# Patient Record
Sex: Female | Born: 1990 | Hispanic: Yes | Marital: Single | Smoking: Never smoker
Health system: Southern US, Community
[De-identification: ages and names within clinical notes are randomized; demographics above are authoritative.]

---

## 2019-04-15 ENCOUNTER — Encounter (HOSPITAL_COMMUNITY): Payer: Self-pay

## 2019-04-15 ENCOUNTER — Ambulatory Visit (HOSPITAL_COMMUNITY)
Admission: EM | Admit: 2019-04-15 | Discharge: 2019-04-15 | Disposition: A | Discharge status: Home / Self Care | Payer: Self-pay | Attending: Emergency Medicine | Admitting: Emergency Medicine

## 2019-04-15 ENCOUNTER — Other Ambulatory Visit: Payer: Self-pay

## 2019-04-15 DIAGNOSIS — Z202 Contact with and (suspected) exposure to infections with a predominantly sexual mode of transmission: Secondary | ICD-10-CM | POA: Insufficient documentation

## 2019-04-15 DIAGNOSIS — Z3202 Encounter for pregnancy test, result negative: Secondary | ICD-10-CM

## 2019-04-15 DIAGNOSIS — Z7251 High risk heterosexual behavior: Secondary | ICD-10-CM | POA: Insufficient documentation

## 2019-04-15 LAB — POC URINE PREG, ED: Preg Test, Ur: NEGATIVE

## 2019-04-15 LAB — HIV ANTIBODY (ROUTINE TESTING W REFLEX): HIV Screen 4th Generation wRfx: NONREACTIVE

## 2019-04-15 LAB — POCT PREGNANCY, URINE: Preg Test, Ur: NEGATIVE

## 2019-04-15 MED ORDER — AZITHROMYCIN 250 MG PO TABS
1000.0000 mg | ORAL_TABLET | Freq: Once | ORAL | Status: AC
  Administered 2019-04-15: 1000 mg via ORAL

## 2019-04-15 MED ORDER — AZITHROMYCIN 250 MG PO TABS
ORAL_TABLET | ORAL | Status: AC
  Filled 2019-04-15: qty 4

## 2019-04-15 MED ORDER — CEFTRIAXONE SODIUM 250 MG IJ SOLR
250.0000 mg | Freq: Once | INTRAMUSCULAR | Status: AC
  Administered 2019-04-15: 250 mg via INTRAMUSCULAR

## 2019-04-15 MED ORDER — CEFTRIAXONE SODIUM 250 MG IJ SOLR
INTRAMUSCULAR | Status: AC
  Filled 2019-04-15: qty 250

## 2019-04-15 NOTE — Discharge Instructions (Addendum)
Will notify you of any positive findings and if any changes to treatment are needed.   You may monitor your results on your MyChart online as well.   Please withhold from intercourse for the next week. Please use condoms to prevent STD's.

## 2019-04-15 NOTE — ED Triage Notes (Signed)
Pt. States she had a 4 some & 2 of the 4 people had tested POSITIVE for Gonorrhea & just finished their treatment. She states that she wants STD/BV/HIV/AIDS testing.

## 2019-04-15 NOTE — ED Provider Notes (Signed)
[facility]    CSN: [id number] Arrival date & time: 04/15/19  1647      History   Chief Complaint Chief Complaint  Patient presents with  . S.74    HPI Donna Avila is a 28 y.o. female.   Donna Avila presents with concerns about std exposure. She is visiting from [place], and on 11/27 and last night had sexual relations with 4 men and her friend/cousin. States two of involved parties just completed treatment for gonorrhea the night prior to the activity. She denies any vaginal symptoms. Has had std's in the past. Doesn't use condoms regularly. No pelvic pain. No urinary symptoms. LMp was 6 mo ago, she is on depo.    ROS per HPI, negative if not otherwise mentioned.      History reviewed. No pertinent past medical history.  There are no active problems to display for this patient.   History reviewed. No pertinent surgical history.  OB History   No obstetric history on file.      Home Medications    Prior to Admission medications   Not on File    Family History Family History  Problem Relation Age of Onset  . Diabetes Mother   . Hypertension Mother   . Arthritis Mother   . Healthy Father     Social History Social History   Tobacco Use  . Smoking status: Never Smoker  . Smokeless tobacco: Never Used  Substance Use Topics  . Alcohol use: Yes  . Drug use: Never     Allergies   Patient has no known allergies.   Review of Systems Review of Systems   Physical Exam Triage Vital Signs ED Triage Vitals  Enc Vitals Group     BP 04/15/19 1800 (!) 114/51     Pulse Rate 04/15/19 1800 61     Resp 04/15/19 1800 19     Temp 04/15/19 1800 97.8 F (36.6 C)     Temp Source 04/15/19 1800 Oral     SpO2 04/15/19 1800 98 %     Weight 04/15/19 1751 215 lb (97.5 kg)     Height --      Head Circumference --      Peak Flow --      Pain Score 04/15/19 1751 0     Pain Loc --      Pain Edu? --      Excl. in [place]? --    No data found.  Updated  Vital Signs BP (!) 114/51 (BP Location: Left Arm)   Pulse 61   Temp 97.8 F (36.6 C) (Oral)   Resp 19   Wt 215 lb (97.5 kg)   SpO2 98%    Physical Exam Constitutional:      General: She is not in acute distress.    Appearance: She is well-developed.  Cardiovascular:     Rate and Rhythm: Normal rate.  Pulmonary:     Effort: Pulmonary effort is normal.  Abdominal:     Palpations: Abdomen is not rigid.     Tenderness: There is no abdominal tenderness. There is no guarding or rebound.  Genitourinary:    Comments: Denies sores, lesions, vaginal bleeding; no pelvic pain; gu exam deferred at this time, vaginal self swab collected.   Skin:    General: Skin is warm and dry.  Neurological:     Mental Status: She is alert and oriented to person, place, and time.      UC Treatments / Results  Labs (all labs ordered are listed, but only abnormal results are displayed) Labs Reviewed  RPR  HIV ANTIBODY (ROUTINE TESTING W REFLEX)  POC URINE PREG, ED  POCT PREGNANCY, URINE  CERVICOVAGINAL ANCILLARY ONLY    EKG   Radiology No results found.  Procedures Procedures (including critical care time)  Medications Ordered in UC Medications  azithromycin (ZITHROMAX) tablet 1,000 mg (1,000 mg Oral Given 04/15/19 1827)  cefTRIAXone (ROCEPHIN) injection 250 mg (250 mg Intramuscular Given 04/15/19 1827)  azithromycin (ZITHROMAX) 250 MG tablet (has no administration in time range)  cefTRIAXone (ROCEPHIN) 250 MG injection (has no administration in time range)    Initial Impression / Assessment and Plan / UC Course  I have reviewed the triage vital signs and the nursing notes.  Pertinent labs & imaging results that were available during my care of the patient were reviewed by me and considered in my medical decision making (see chart for details).     Empiric gonorrhea treatment provided tonight pending vaginal cytology. RPR and HIV testing also completed. Safe sex encouraged. Patient  verbalized understanding and agreeable to plan.   Final Clinical Impressions(s) / UC Diagnoses   Final diagnoses:  High risk sexual behavior, unspecified type  Possible exposure to STD     Discharge Instructions     Will notify you of any positive findings and if any changes to treatment are needed.   You may monitor your results on your MyChart online as well.   Please withhold from intercourse for the next week. Please use condoms to prevent STD's.     ED Prescriptions    None     PDMP not reviewed this encounter.   Georgetta Haber, NP 04/15/19 [phone]

## 2019-04-16 LAB — RPR: RPR Ser Ql: NONREACTIVE

## 2019-04-18 LAB — CERVICOVAGINAL ANCILLARY ONLY
Bacterial vaginitis: POSITIVE — AB
Candida vaginitis: NEGATIVE
Chlamydia: NEGATIVE
Neisseria Gonorrhea: NEGATIVE
Trichomonas: NEGATIVE

## 2019-04-19 ENCOUNTER — Telehealth (HOSPITAL_COMMUNITY): Payer: Self-pay | Admitting: Emergency Medicine

## 2019-04-19 MED ORDER — METRONIDAZOLE 500 MG PO TABS: 500.0000 mg | ORAL_TABLET | Freq: Two times a day (BID) | ORAL | 0 refills | Status: AC

## 2019-04-19 NOTE — Telephone Encounter (Signed)
Bacterial vaginosis is positive. This was not treated at the urgent care visit.  Flagyl 500 mg BID x 7 days #14 no refills sent to patients pharmacy of choice.    Patient contacted by phone and made aware of    results. Pt verbalized understanding and had all questions answered.    

## 2019-04-19 NOTE — Telephone Encounter (Signed)
Pharmacy change

## 2020-05-10 ENCOUNTER — Encounter (HOSPITAL_COMMUNITY): Payer: Self-pay

## 2020-05-10 ENCOUNTER — Other Ambulatory Visit: Payer: Self-pay

## 2020-05-10 ENCOUNTER — Emergency Department (HOSPITAL_COMMUNITY)
Admission: EM | Admit: 2020-05-10 | Discharge: 2020-05-10 | Disposition: A | Discharge status: Home / Self Care | Payer: HRSA Program | Attending: Emergency Medicine | Admitting: Emergency Medicine

## 2020-05-10 ENCOUNTER — Emergency Department (HOSPITAL_COMMUNITY): Payer: HRSA Program

## 2020-05-10 DIAGNOSIS — U071 COVID-19: Secondary | ICD-10-CM | POA: Diagnosis not present

## 2020-05-10 DIAGNOSIS — R059 Cough, unspecified: Secondary | ICD-10-CM | POA: Diagnosis present

## 2020-05-10 LAB — RESP PANEL BY RT-PCR (FLU A&B, COVID) ARPGX2
Influenza A by PCR: NEGATIVE
Influenza B by PCR: NEGATIVE
SARS Coronavirus 2 by RT PCR: POSITIVE — AB

## 2020-05-10 MED ORDER — ONDANSETRON 4 MG PO TBDP: 4.0000 mg | ORAL_TABLET | Freq: Three times a day (TID) | ORAL | 0 refills | Status: AC | PRN

## 2020-05-10 MED ORDER — ACETAMINOPHEN 325 MG PO TABS
650.0000 mg | ORAL_TABLET | Freq: Once | ORAL | Status: AC | PRN
  Administered 2020-05-10: 650 mg via ORAL
  Filled 2020-05-10: qty 2

## 2020-05-10 NOTE — Discharge Instructions (Addendum)
Like we discussed, I am prescribing a medication called Zofran. This will help with your nausea. Only take it if you are experiencing nausea and vomiting that you cannot control and is causing you to not get adequate hydration.  You need to make sure that you are quarantining for 10 to 14 days from onset of your symptoms.  You can continue to take Tylenol and Motrin for management of your fevers and body aches. Follow the instructions on the bottle.  Also consider purchasing a pulse oximeter. You can find these online for $10-$15. If your saturations drop below 90% and stay there for 3 to 5 minutes, it is reasonable to come back to the emergency department and be reevaluated.  It was a pleasure to meet you.

## 2020-05-10 NOTE — ED Provider Notes (Signed)
[facility] EMERGENCY DEPARTMENT Provider Note   CSN: [id number] Arrival date & time: 05/10/20  1025     History Chief Complaint  Patient presents with  . Generalized Body Aches    Donna Avila is a 30 y.o. female.  HPI Patient is a 30 year old female who presents the emergency department with cough. She states it started yesterday. She woke up this morning with fever as well as body aches. She states she felt mildly short of breath this morning as well but this is since resolved. Denies any chest pain. She has had 1 dose of the Pfizer vaccine about 3 months ago but did not receive a second dose. Reports associated nausea without vomiting. No abdominal pain, leg swelling, or calf pain.    History reviewed. No pertinent past medical history.  There are no problems to display for this patient.   History reviewed. No pertinent surgical history.   OB History   No obstetric history on file.     Family History  Problem Relation Age of Onset  . Diabetes Mother   . Hypertension Mother   . Arthritis Mother   . Healthy Father     Social History   Tobacco Use  . Smoking status: Never Smoker  . Smokeless tobacco: Never Used  Substance Use Topics  . Alcohol use: Yes  . Drug use: Never    Home Medications Prior to Admission medications   Not on File    Allergies    Patient has no known allergies.  Review of Systems   Review of Systems  Constitutional: Positive for activity change, appetite change, chills, fatigue and fever.  HENT: Positive for congestion, postnasal drip, rhinorrhea and sneezing.   Respiratory: Positive for cough. Negative for shortness of breath.   Cardiovascular: Negative for chest pain.  Gastrointestinal: Positive for nausea. Negative for abdominal pain and vomiting.    Physical Exam Updated Vital Signs BP 100/90 (BP Location: Right Arm)   Pulse 91   Temp 99.6 F (37.6 C) (Oral)   Resp 20   SpO2 100%   Physical  Exam Vitals and nursing note reviewed.  Constitutional:      General: She is not in acute distress.    Appearance: Normal appearance. She is not ill-appearing, toxic-appearing or diaphoretic.  HENT:     Head: Normocephalic and atraumatic.     Right Ear: External ear normal.     Left Ear: External ear normal.     Nose: Nose normal.     Mouth/Throat:     Pharynx: Oropharynx is clear.  Eyes:     Extraocular Movements: Extraocular movements intact.  Cardiovascular:     Rate and Rhythm: Normal rate and regular rhythm.     Pulses: Normal pulses.     Heart sounds: Normal heart sounds. No murmur heard. No friction rub. No gallop.   Pulmonary:     Effort: Pulmonary effort is normal. No respiratory distress.     Breath sounds: Normal breath sounds. No stridor. No wheezing, rhonchi or rales.     Comments: Lungs are clear to auscultation bilaterally. Symmetrical rise and fall the chest with inspiration and expiration. Oxygen saturations around 100% on room air. Speaking in clear and complete sentences. No respiratory distress noted. Abdominal:     General: Abdomen is flat.     Palpations: Abdomen is soft.     Tenderness: There is no abdominal tenderness.  Musculoskeletal:        General: No tenderness. Normal range  of motion.     Cervical back: Normal range of motion and neck supple. No tenderness.     Right lower leg: No edema.     Left lower leg: No edema.     Comments: No leg swelling. No calf pain.  Skin:    General: Skin is warm and dry.  Neurological:     General: No focal deficit present.     Mental Status: She is alert and oriented to person, place, and time.  Psychiatric:        Mood and Affect: Mood normal.        Behavior: Behavior normal.     ED Results / Procedures / Treatments   Labs (all labs ordered are listed, but only abnormal results are displayed) Labs Reviewed  RESP PANEL BY RT-PCR (FLU A&B, COVID) ARPGX2 - Abnormal; Notable for the following components:       Result Value   SARS Coronavirus 2 by RT PCR POSITIVE (*)    All other components within normal limits    EKG None  Radiology DG Chest Portable 1 View  Result Date: 05/10/2020 CLINICAL DATA:  Cough EXAM: PORTABLE CHEST 1 VIEW COMPARISON:  None. FINDINGS: The heart size and mediastinal contours are within normal limits. Minimal opacity of medial right lung base, developing pneumonia is not excluded. There is no pulmonary edema or pleural effusion. The visualized skeletal structures are unremarkable. IMPRESSION: Minimal opacity of medial right lung base, developing pneumonia is not excluded. Electronically Signed   By: Sherian Rein M.D.   On: 05/10/2020 13:23   Procedures Procedures (including critical care time)  Medications Ordered in ED Medications  acetaminophen (TYLENOL) tablet 650 mg (650 mg Oral Given 05/10/20 1105)   ED Course  I have reviewed the triage vital signs and the nursing notes.  Pertinent labs & imaging results that were available during my care of the patient were reviewed by me and considered in my medical decision making (see chart for details).  Clinical Course as of 05/10/20 1706  Sun May 10, 2020  1623 DG Chest Portable 1 View IMPRESSION: Minimal opacity of medial right lung base, developing pneumonia is not excluded. [LJ]    Clinical Course User Index [LJ] Placido Sou, PA-C   MDM Rules/Calculators/A&P                          Patient is a 30 year old female who presents the emergency department with symptoms related to COVID-19. She is on day 2 of symptoms. She has had 1 dose of the Pfizer vaccine.  No current chest pain or shortness of breath. No hypoxia on my exam. No respiratory distress noted. She initially presented with a fever of 101.6 F. She was given Tylenol in triage and this is resolved. She states that she is actually feeling much better since arrival. Chest x-ray shows a minimal opacity of the medial right lung base. Possible developing  pneumonia.  Discussed OTC meds for symptoms. Tylenol and Motrin for fevers and body aches. Rest and hydration. Quarantine for 10 to 14 days from symptom onset. We discussed return precautions at length. Her questions were answered and she was amicable at the time of discharge. Her vital signs are stable.  Final Clinical Impression(s) / ED Diagnoses Final diagnoses:  COVID-19   Rx / DC Orders ED Discharge Orders    None       Placido Sou, PA-C 05/10/20 1706    Milagros Loll,  MD 05/11/20 2336

## 2020-05-10 NOTE — ED Triage Notes (Signed)
Patient reports that she awoke with cough, fever and body aches since am. Patient speaking complete sentences, Nad

## 2020-05-15 ENCOUNTER — Emergency Department (HOSPITAL_COMMUNITY)
Admission: EM | Admit: 2020-05-15 | Discharge: 2020-05-15 | Disposition: A | Discharge status: Home / Self Care | Payer: HRSA Program | Attending: Emergency Medicine | Admitting: Emergency Medicine

## 2020-05-15 ENCOUNTER — Ambulatory Visit: Payer: Self-pay

## 2020-05-15 ENCOUNTER — Other Ambulatory Visit: Payer: Self-pay

## 2020-05-15 ENCOUNTER — Emergency Department (HOSPITAL_COMMUNITY): Payer: HRSA Program

## 2020-05-15 DIAGNOSIS — U071 COVID-19: Secondary | ICD-10-CM | POA: Diagnosis not present

## 2020-05-15 DIAGNOSIS — R059 Cough, unspecified: Secondary | ICD-10-CM | POA: Diagnosis present

## 2020-05-15 LAB — CBC
HCT: 37.7 % (ref 36.0–46.0)
Hemoglobin: 12.4 g/dL (ref 12.0–15.0)
MCH: 30.4 pg (ref 26.0–34.0)
MCHC: 32.9 g/dL (ref 30.0–36.0)
MCV: 92.4 fL (ref 80.0–100.0)
Platelets: 262 10*3/uL (ref 150–400)
RBC: 4.08 MIL/uL (ref 3.87–5.11)
RDW: 14.2 % (ref 11.5–15.5)
WBC: 9.4 10*3/uL (ref 4.0–10.5)
nRBC: 0 % (ref 0.0–0.2)

## 2020-05-15 LAB — BASIC METABOLIC PANEL
Anion gap: 11 (ref 5–15)
BUN: 5 mg/dL — ABNORMAL LOW (ref 6–20)
CO2: 26 mmol/L (ref 22–32)
Calcium: 8 mg/dL — ABNORMAL LOW (ref 8.9–10.3)
Chloride: 100 mmol/L (ref 98–111)
Creatinine, Ser: 0.84 mg/dL (ref 0.44–1.00)
GFR, Estimated: >60 mL/min (ref >60)
Glucose, Bld: 113 mg/dL — ABNORMAL HIGH (ref 70–99)
Potassium: 4.1 mmol/L (ref 3.5–5.1)
Sodium: 137 mmol/L (ref 135–145)

## 2020-05-15 MED ORDER — ALBUTEROL SULFATE HFA 108 (90 BASE) MCG/ACT IN AERS: 2.0000 | INHALATION_SPRAY | RESPIRATORY_TRACT | Status: DC | PRN

## 2020-05-15 MED ORDER — ACETAMINOPHEN 325 MG PO TABS
650.0000 mg | ORAL_TABLET | Freq: Once | ORAL | Status: AC | PRN
  Administered 2020-05-15: 650 mg via ORAL

## 2020-05-15 NOTE — Discharge Instructions (Addendum)
Call your primary care doctor in the next 1-2 days to arrange video follow-up.  Use a finger pulse oximeter at home.  You may purchase one at [facility] or [facility] or online.  If the numbers drops and stays below 90%, return immediately back to the ER.  Otherwise increase your fluid intake, isolate at home for 5 days after symptoms resolve, and inform recent close contacts of the need to test for Covid.  

## 2020-05-15 NOTE — ED Provider Notes (Signed)
[facility] EMERGENCY DEPARTMENT Provider Note   CSN: [id number] Arrival date & time: 05/15/20  1132     History Chief Complaint  Patient presents with  . Covid Positive  . Shortness of Breath    Donna Avila is a 30 y.o. female.  Patient presents to ER chief complaint of cough shortness of breath and fevers.  Has been ongoing for 7 days now.  She has a positive for Covid about 6 days ago.  She has a finger pulse oximeter at home and the numbers read in the 80s and she was told by nursing staff to come to the ER.        No past medical history on file.  There are no problems to display for this patient.   No past surgical history on file.   OB History   No obstetric history on file.     Family History  Problem Relation Age of Onset  . Diabetes Mother   . Hypertension Mother   . Arthritis Mother   . Healthy Father     Social History   Tobacco Use  . Smoking status: Never Smoker  . Smokeless tobacco: Never Used  Substance Use Topics  . Alcohol use: Yes  . Drug use: Never    Home Medications Prior to Admission medications   Medication Sig Start Date End Date Taking? Authorizing Provider  ondansetron (ZOFRAN ODT) 4 MG disintegrating tablet Take 1 tablet (4 mg total) by mouth every 8 (eight) hours as needed for nausea or vomiting. 05/10/20   Placido Sou, PA-C    Allergies    Patient has no known allergies.  Review of Systems   Review of Systems  Constitutional: Positive for fever.  HENT: Negative for ear pain.   Eyes: Negative for pain.  Respiratory: Positive for cough.   Cardiovascular: Negative for chest pain.  Gastrointestinal: Negative for abdominal pain.  Genitourinary: Negative for flank pain.  Musculoskeletal: Negative for back pain.  Skin: Negative for rash.  Neurological: Negative for headaches.    Physical Exam Updated Vital Signs BP (!) 97/56 (BP Location: Right Arm)   Pulse 72   Temp 98.8 F (37.1 C) (Oral)    Resp 17   Ht 4\' 7"  (1.397 m)   Wt 111.1 kg   LMP 05/10/2020 (Within Days)   SpO2 97%   BMI 56.94 kg/m   Physical Exam Constitutional:      General: She is not in acute distress.    Appearance: Normal appearance.  HENT:     Head: Normocephalic.     Nose: Nose normal.  Eyes:     Extraocular Movements: Extraocular movements intact.  Cardiovascular:     Rate and Rhythm: Normal rate.  Pulmonary:     Effort: Pulmonary effort is normal.  Musculoskeletal:        General: Normal range of motion.     Cervical back: Normal range of motion.  Neurological:     General: No focal deficit present.     Mental Status: She is alert. Mental status is at baseline.     ED Results / Procedures / Treatments   Labs (all labs ordered are listed, but only abnormal results are displayed) Labs Reviewed  BASIC METABOLIC PANEL - Abnormal; Notable for the following components:      Result Value   Glucose, Bld 113 (*)    BUN 5 (*)    Calcium 8.0 (*)    All other components within normal limits  CBC    EKG None  Radiology DG Chest Portable 1 View  Result Date: 05/15/2020 CLINICAL DATA:  COVID positive.  Short of breath. EXAM: PORTABLE CHEST 1 VIEW COMPARISON:  05/10/2020. FINDINGS: Study limited by the semi-erect AP technique, relatively low lung volumes and the patient's body habitus. Allowing for this, subtle hazy airspace opacity is suggested in the right peripheral mid to lower lung. Remainder of the lungs is clear. No convincing pleural effusion and no pneumothorax. Cardiac silhouette is normal in size. Normal mediastinal and hilar contours. Skeletal structures are grossly intact. IMPRESSION: 1. Limited exam as detailed above. Suspect subtle airspace lung opacities in the right mid to lower lung consistent with multifocal infection. Electronically Signed   By: Amie Portland M.D.   On: 05/15/2020 12:32    Procedures Procedures (including critical care time)  Medications Ordered in  ED Medications  albuterol (VENTOLIN HFA) 108 (90 Base) MCG/ACT inhaler 2 puff (has no administration in time range)  acetaminophen (TYLENOL) tablet 650 mg (650 mg Oral Given 05/15/20 1208)    ED Course  I have reviewed the triage vital signs and the nursing notes.  Pertinent labs & imaging results that were available during my care of the patient were reviewed by me and considered in my medical decision making (see chart for details).    MDM Rules/Calculators/A&P                          Chest x-ray shows subtle findings.  Patient's O2 saturation 96 to 97% on room air which is appropriate.  Advised rest and isolation at home and increase fluid intake.  Advised immediate return if her finger also oximeter numbers drop below 90% and do not go back above it or if she has any additional concerns or difficulty breathing to return immediately back to the ER.  Otherwise advised follow-up with her primary care doctor via video evaluation within the next 2 days.   Final Clinical Impression(s) / ED Diagnoses Final diagnoses:  COVID-19 virus infection    Rx / DC Orders ED Discharge Orders    None       Cheryll Cockayne, MD 05/15/20 [phone]

## 2020-05-15 NOTE — ED Triage Notes (Signed)
Pt here with reports of covid+ and increased shob over the last few days.

## 2020-05-15 NOTE — Telephone Encounter (Signed)
Ret'd. Call to pt.  Reported COVID positive on 05/10/20; went to ER.  Advised she had developing pneumonia.  C/o increased coughing episodes with intermittent shortness of breath.  Reporting cough is dry with some mucus.  Had difficulty talking due to constant cough.  Coughing forcefully at times and vomits.  Taking Robitussin TID.  Drinking fluids and able to keep down some fluids.  C/o chest tightness on right side and right shoulder area; rated at 7/10 at present time; and has increased to 10/10 at times.  Pulse ox 85-90 % on room air.  Advised to go ER now due to worsening symptoms.            Reason for Disposition . Chest pain or pressure  Answer Assessment - Initial Assessment Questions 1. COVID-19 DIAGNOSIS: "Who made your COVID-19 diagnosis?" "Was it confirmed by a positive lab test?" If not diagnosed by a HCP, ask "Are there lots of cases (community spread) where you live?" Note: See public health department website, if unsure.     Tested positive on 1/2 in ER 2. COVID-19 EXPOSURE: "Was there any known exposure to COVID before the symptoms began?" CDC Definition of close contact: within 6 feet (2 meters) for a total of 15 minutes or more over a 24-hour period.      *No Answer* 3. ONSET: "When did the COVID-19 symptoms start?"      1/1 4. WORST SYMPTOM: "What is your worst symptom?" (e.g., cough, fever, shortness of breath, muscle aches)     Cough and shortness of breath  5. COUGH: "Do you have a cough?" If Yes, ask: "How bad is the cough?"       Dry cough with intermittent mucus prod.  6. FEVER: "Do you have a fever?" If Yes, ask: "What is your temperature, how was it measured, and when did it start?"     No fever today 7. RESPIRATORY STATUS: "Describe your breathing?" (e.g., shortness of breath, wheezing, unable to speak)      SOB with coughing episodes  8. BETTER-SAME-WORSE: "Are you getting better, staying the same or getting worse compared to yesterday?"  If getting worse, ask,  "In what way?"     Worse- decreased appetite, nausea and vomiting - Zofran not helping 9. HIGH RISK DISEASE: "Do you have any chronic medical problems?" (e.g., asthma, heart or lung disease, weak immune system, obesity, etc.)     denied 10. VACCINE: "Have you gotten the COVID-19 vaccine?" If Yes ask: "Which one, how many shots, when did you get it?"       Vaccine x 1 in Oct.  11. PREGNANCY: "Is there any chance you are pregnant?" "When was your last menstrual period?"       *No Answer* 12. OTHER SYMPTOMS: "Do you have any other symptoms?"  (e.g., chills, fatigue, headache, loss of smell or taste, muscle pain, sore throat; new loss of smell or taste especially support the diagnosis of COVID-19)       *No Answer*  Protocols used: CORONAVIRUS (COVID-19) DIAGNOSED OR SUSPECTED-A-AH  Message from Celene Kras sent at 05/15/2020 9:34 AM EST  Pt called stating that she is experiencing very bad covid symptoms. She states that she is needing advice and is requesting to have a nurse give her a call back. Please advise.

## 2021-12-11 IMAGING — DX DG CHEST 1V PORT
1 series · 1 of 1 positions shown · non-contrast
Comparison: 05/10/2020.

CLINICAL DATA: COVID positive.  Short of breath.

EXAM:
PORTABLE CHEST 1 VIEW

[chest ap]
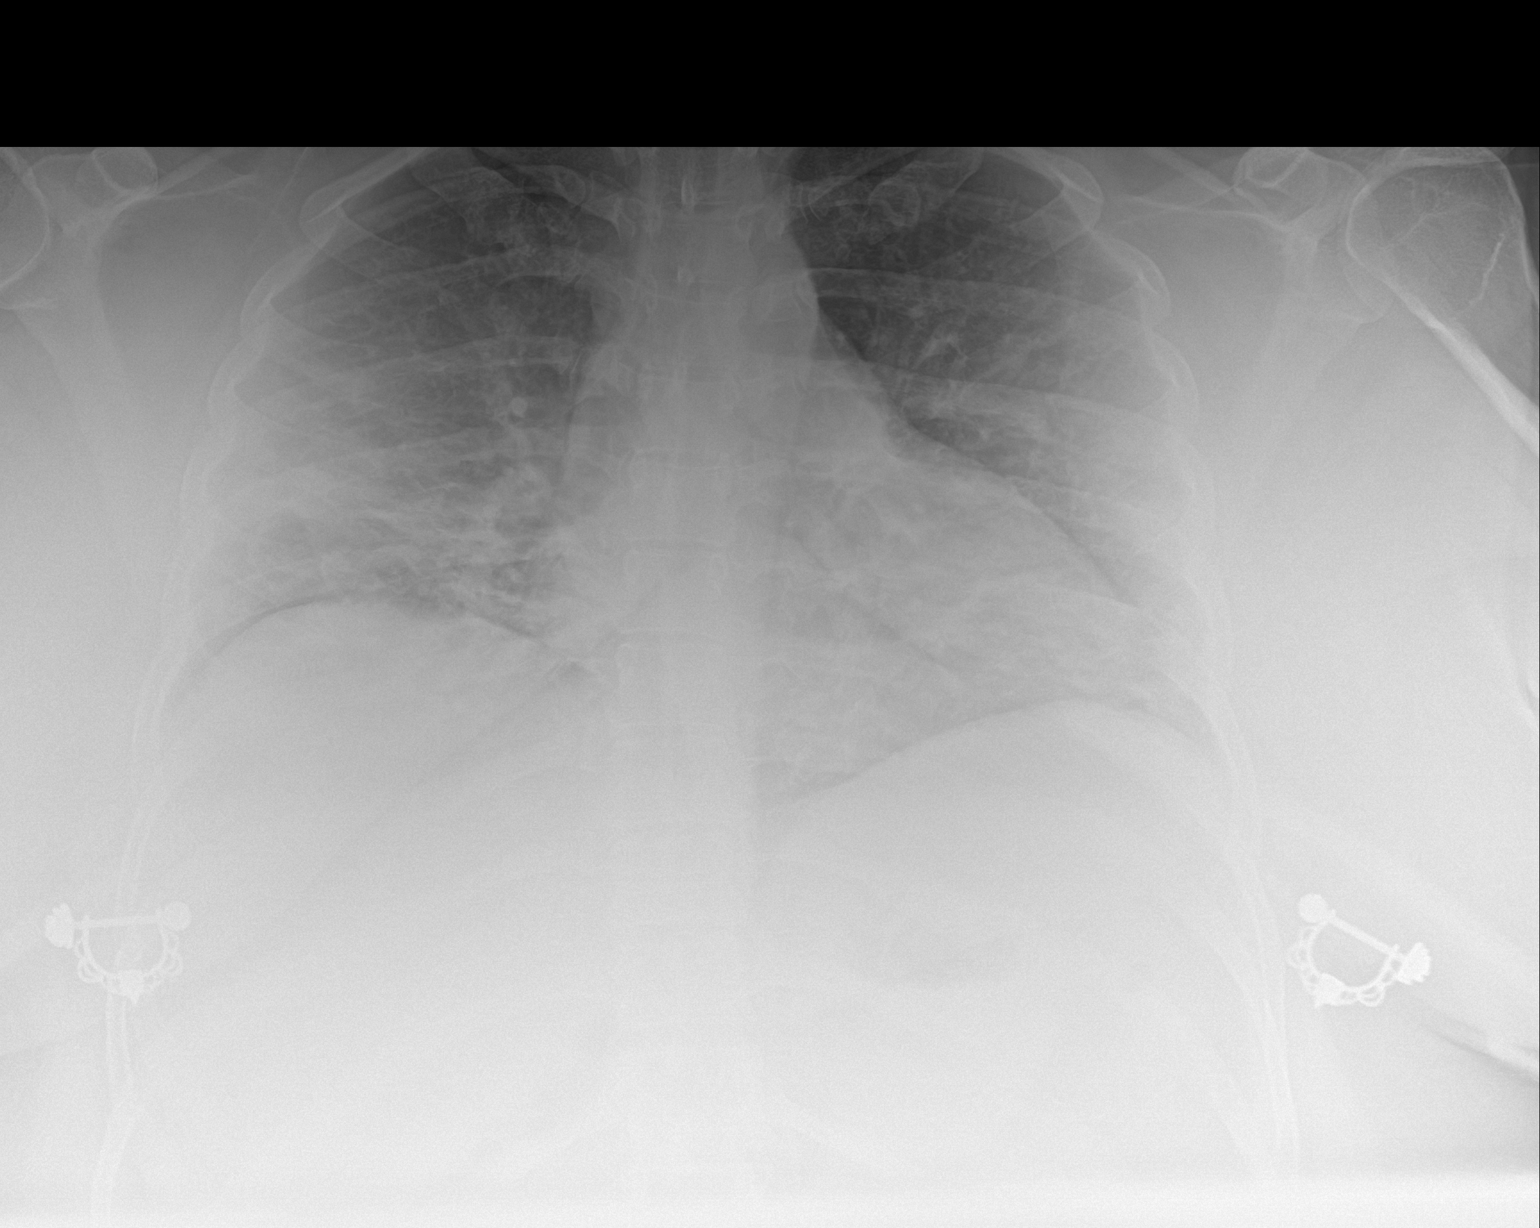

[1 of 1 positions shown; findings below may reference images not displayed]

FINDINGS: Study limited by the semi-erect AP technique, relatively low lung
volumes and the patient's body habitus. Allowing for this, subtle
hazy airspace opacity is suggested in the right peripheral mid to
lower lung. Remainder of the lungs is clear. No convincing pleural
effusion and no pneumothorax.

Cardiac silhouette is normal in size. Normal mediastinal and hilar
contours.

Skeletal structures are grossly intact.
IMPRESSION: 1. Limited exam as detailed above. Suspect subtle airspace lung
opacities in the right mid to lower lung consistent with multifocal
infection.
